# Patient Record
Sex: Male | Born: 1965 | Race: Black or African American | Hispanic: No | Marital: Married | Smoking: Current every day smoker
Health system: Southern US, Community
[De-identification: ages and names within clinical notes are randomized; demographics above are authoritative.]

## PROBLEM LIST (undated history)

## (undated) DIAGNOSIS — C801 Malignant (primary) neoplasm, unspecified: Secondary | ICD-10-CM

## (undated) DIAGNOSIS — I10 Essential (primary) hypertension: Secondary | ICD-10-CM

## (undated) DIAGNOSIS — I219 Acute myocardial infarction, unspecified: Secondary | ICD-10-CM

---

## 2017-11-15 ENCOUNTER — Emergency Department

## 2017-11-15 ENCOUNTER — Other Ambulatory Visit: Payer: Self-pay

## 2017-11-15 DIAGNOSIS — R002 Palpitations: Secondary | ICD-10-CM | POA: Diagnosis not present

## 2017-11-15 DIAGNOSIS — R0789 Other chest pain: Secondary | ICD-10-CM | POA: Insufficient documentation

## 2017-11-15 DIAGNOSIS — I10 Essential (primary) hypertension: Secondary | ICD-10-CM | POA: Insufficient documentation

## 2017-11-15 DIAGNOSIS — I2510 Atherosclerotic heart disease of native coronary artery without angina pectoris: Secondary | ICD-10-CM | POA: Insufficient documentation

## 2017-11-15 LAB — TROPONIN I

## 2017-11-15 LAB — CBC
HCT: 42.8 % (ref 40.0–52.0)
Hemoglobin: 14.4 g/dL (ref 13.0–18.0)
MCH: 29.3 pg (ref 26.0–34.0)
MCHC: 33.6 g/dL (ref 32.0–36.0)
MCV: 87.2 fL (ref 80.0–100.0)
PLATELETS: 343 10*3/uL (ref 150–440)
RBC: 4.91 MIL/uL (ref 4.40–5.90)
RDW: 13.2 % (ref 11.5–14.5)
WBC: 11.1 10*3/uL — AB (ref 3.8–10.6)

## 2017-11-15 LAB — COMPREHENSIVE METABOLIC PANEL
ALBUMIN: 4.3 g/dL (ref 3.5–5.0)
ALT: 32 U/L (ref 17–63)
AST: 23 U/L (ref 15–41)
Alkaline Phosphatase: 77 U/L (ref 38–126)
Anion gap: 10 (ref 5–15)
BUN: 8 mg/dL (ref 6–20)
CHLORIDE: 108 mmol/L (ref 101–111)
CO2: 21 mmol/L — AB (ref 22–32)
CREATININE: 0.94 mg/dL (ref 0.61–1.24)
Calcium: 9 mg/dL (ref 8.9–10.3)
GFR calc Af Amer: >60 mL/min (ref >60)
GLUCOSE: 128 mg/dL — AB (ref 65–99)
POTASSIUM: 3.7 mmol/L (ref 3.5–5.1)
SODIUM: 139 mmol/L (ref 135–145)
Total Bilirubin: 0.7 mg/dL (ref 0.3–1.2)
Total Protein: 7.4 g/dL (ref 6.5–8.1)

## 2017-11-15 NOTE — ED Triage Notes (Signed)
Pt in with co midsternal chest pain that started x 4 months ago. States has hx of angina but pain has been persistent. Took nitro at home x 1 that relieved the pain.

## 2017-11-15 NOTE — ED Notes (Signed)
Pt to STAT desk to st he is going to get something to eat; recommended pt not eat until evaluated further by the provider but cont to st he is going; pt noted leaving ED lobby to main entrance of hospital, to go to [employer]

## 2017-11-16 ENCOUNTER — Emergency Department
Admission: EM | Admit: 2017-11-16 | Discharge: 2017-11-16 | Disposition: A | Discharge status: Home / Self Care | Attending: Emergency Medicine | Admitting: Emergency Medicine

## 2017-11-16 DIAGNOSIS — F419 Anxiety disorder, unspecified: Secondary | ICD-10-CM

## 2017-11-16 DIAGNOSIS — R079 Chest pain, unspecified: Secondary | ICD-10-CM

## 2017-11-16 LAB — TROPONIN I

## 2017-11-16 MED ORDER — LORAZEPAM 1 MG PO TABS: 1.0000 mg | ORAL_TABLET | Freq: Three times a day (TID) | ORAL | 0 refills | Status: AC | PRN

## 2017-11-16 MED ORDER — ASPIRIN 81 MG PO CHEW
324.0000 mg | CHEWABLE_TABLET | Freq: Once | ORAL | Status: AC
  Administered 2017-11-16: 324 mg via ORAL
  Filled 2017-11-16: qty 4

## 2017-11-16 NOTE — ED Notes (Signed)
Patient reports CP that occurred earlier in the night, not associated with SHOB, N/V, Diaphoresis or vomiting. Patient states a history of PTSD due to previous military status, and two grown daughters living at home with he and his wife. Patient states he feels like it may have been related to anxiety. States currently chest pain-free. Patient does not have a cardiologist though moved here in 2016. Notes previous MI in 2010 without stent placement; just medications. Patient is alert and oriented, pleasant African American male. Appears well dressed and is pleasant during conversation.

## 2017-11-16 NOTE — ED Notes (Signed)
Blood drawn and sent for repeat troponin. Patient tolerated po medications without incident.

## 2017-11-16 NOTE — ED Provider Notes (Signed)
[facility] Emergency Department Provider Note   ____________________________________________   First MD Initiated Contact with Patient 11/16/17 [phone]     (approximate)  I have reviewed the triage vital signs and the nursing notes.   HISTORY  Chief Complaint Chest Pain    HPI Carl Quinn is a 52 y.o. male who presents to the ED from home with a chief complaint of chest pain.  Patient has a history of CAD status post MI in 2010, treated medically.  Moved to the area 2 years ago and has not establish cardiology care.  Reports intermittent midsternal chest pain for the past 4 months.  Pattern related to anxiety.  Reports increased anxiety last week.  Has had a 3-day history of intermittent chest pain and decided to come in for evaluation.  Pain is associated with occasional palpitations.  Denies associated diaphoresis, shortness of breath, nausea/vomiting, dizziness.  Took sublingual nitroglycerin at home which relieved the pain.  Denies recent fever, chills, cough, congestion, abdominal pain, dysuria, diarrhea.  Denies recent travel, trauma or hormone use.   Past medical history CAD Hypertension  There are no active problems to display for this patient.    Prior to Admission medications   Medication Sig Start Date End Date Taking? Authorizing Provider  LORazepam (ATIVAN) 1 MG tablet Take 1 tablet (1 mg total) by mouth every 8 (eight) hours as needed for anxiety. 11/16/17   Paulette Blanch, MD    Allergies Patient has no known allergies.  Family history None for CAD  Social History Social History   Tobacco Use  . Smoking status: Not on file  Substance Use Topics  . Alcohol use: Not on file  . Drug use: Not on file  Smoker  Review of Systems  Constitutional: No fever/chills. Eyes: No visual changes. ENT: No sore throat. Cardiovascular: Positive for chest pain. Respiratory: Denies shortness of breath. Gastrointestinal: No abdominal pain.  No  nausea, no vomiting.  No diarrhea.  No constipation. Genitourinary: Negative for dysuria. Musculoskeletal: Negative for back pain. Skin: Negative for rash. Neurological: Negative for headaches, focal weakness or numbness.   ____________________________________________   PHYSICAL EXAM:  VITAL SIGNS: ED Triage Vitals  Enc Vitals Group     BP 11/15/17 2240 (!) 149/92     Pulse Rate 11/15/17 2240 69     Resp 11/15/17 2240 16     Temp 11/15/17 2240 98.2 F (36.8 C)     Temp src --      SpO2 11/15/17 2240 96 %     Weight 11/15/17 2239 222 lb (100.7 kg)     Height 11/15/17 2239 6\' 2"  (1.88 m)     Head Circumference --      Peak Flow --      Pain Score 11/15/17 2239 3     Pain Loc --      Pain Edu? --      Excl. in [place]? --     Constitutional: Alert and oriented. Well appearing and in no acute distress. Eyes: Conjunctivae are normal. PERRL. EOMI. Head: Atraumatic. Nose: No congestion/rhinnorhea. Mouth/Throat: Mucous membranes are moist.  Oropharynx non-erythematous. Neck: No stridor.   Cardiovascular: Normal rate, regular rhythm. Grossly normal heart sounds.  Good peripheral circulation. Respiratory: Normal respiratory effort.  No retractions. Lungs CTAB. Gastrointestinal: Soft and nontender. No distention. No abdominal bruits. No CVA tenderness. Musculoskeletal: No lower extremity tenderness nor edema.  No joint effusions. Neurologic:  Normal speech and language. No gross focal neurologic deficits  are appreciated. No gait instability. Skin:  Skin is warm, dry and intact. No rash noted. Psychiatric: Mood and affect are normal. Speech and behavior are normal.  ____________________________________________   LABS (all labs ordered are listed, but only abnormal results are displayed)  Labs Reviewed  CBC - Abnormal; Notable for the following components:      Result Value   WBC 11.1 (*)    All other components within normal limits  COMPREHENSIVE METABOLIC PANEL - Abnormal;  Notable for the following components:   CO2 21 (*)    Glucose, Bld 128 (*)    All other components within normal limits  TROPONIN I  TROPONIN I   ____________________________________________  EKG  ED ECG REPORT I, Race Latour J, the attending physician, personally viewed and interpreted this ECG.   Date: 11/16/2017  EKG Time: 2241  Rate: 67  Rhythm: normal EKG, normal sinus rhythm  Axis: Normal  Intervals:none  ST&T Change: Nonspecific  ____________________________________________  RADIOLOGY  Dg Chest 2 View  Result Date: 11/15/2017 CLINICAL DATA:  Chest pain EXAM: CHEST  2 VIEW COMPARISON:  None. FINDINGS: Normal heart size. Normal mediastinal contour. No pneumothorax. No pleural effusion. Lungs appear clear, with no acute consolidative airspace disease and no pulmonary edema. IMPRESSION: No active cardiopulmonary disease. Electronically Signed   By: Ilona Sorrel M.D.   On: 11/15/2017 22:57    ____________________________________________   PROCEDURES  Procedure(s) performed: None  Procedures  Critical Care performed: No  ____________________________________________   INITIAL IMPRESSION / ASSESSMENT AND PLAN / ED COURSE  As part of my medical decision making, I reviewed the following data within the [place] notes reviewed and incorporated, Labs reviewed, EKG interpreted, Old chart reviewed (none available), Radiograph reviewed and Notes from prior ED visits (none available).   52 year old male who presents with chest pain. Differential diagnosis includes, but is not limited to, ACS, aortic dissection, pulmonary embolism, cardiac tamponade, pneumothorax, pneumonia, pericarditis, myocarditis, GI-related causes including esophagitis/gastritis, and musculoskeletal chest wall pain.    Initial EKG and troponin unremarkable.  Sounds like anxiety is a component which contributes to patient's pain.  Currently chest pain-free.  He is a [place] patient.   Anticipate discharge home if repeat troponin remains unremarkable.  Will provide local cardiology follow-up if patient desires.  Clinical Course as of Nov 16 598  Wed Nov 16, 2017  0317 Patient remains chest pain-free.  Updated him of repeat negative troponin.  He will try to establish cardiology follow-up at the [place].  Will also refer him to local cardiology.  Limited quantity of Ativan prescribed for anxiety.  Strict return precautions given.  Patient verbalizes understanding and agrees with plan of care.  [JS]    Clinical Course User Index [JS] Paulette Blanch, MD     ____________________________________________   FINAL CLINICAL IMPRESSION(S) / ED DIAGNOSES  Final diagnoses:  Chest pain, unspecified type  Anxiety     ED Discharge Orders        Ordered    LORazepam (ATIVAN) 1 MG tablet  Every 8 hours PRN     11/16/17 0318       Note:  This document was prepared using Dragon voice recognition software and may include unintentional dictation errors.    Paulette Blanch, MD 11/16/17 0600

## 2017-11-16 NOTE — Discharge Instructions (Signed)
1.  Continue all current medicines as directed by your doctor. 2.  You may take Ativan as needed for anxiety/stress. 3.  Return to the ER for worsening symptoms, persistent vomiting, difficulty breathing or other concerns.

## 2017-12-28 ENCOUNTER — Ambulatory Visit: Payer: TRICARE For Life (TFL) | Admitting: Internal Medicine

## 2019-05-05 ENCOUNTER — Encounter: Payer: Self-pay | Admitting: Emergency Medicine

## 2019-05-05 ENCOUNTER — Emergency Department
Admission: EM | Admit: 2019-05-05 | Discharge: 2019-05-05 | Disposition: A | Discharge status: Home / Self Care | Attending: Emergency Medicine | Admitting: Emergency Medicine

## 2019-05-05 ENCOUNTER — Emergency Department

## 2019-05-05 ENCOUNTER — Other Ambulatory Visit: Payer: Self-pay

## 2019-05-05 DIAGNOSIS — E860 Dehydration: Secondary | ICD-10-CM | POA: Insufficient documentation

## 2019-05-05 DIAGNOSIS — C61 Malignant neoplasm of prostate: Secondary | ICD-10-CM | POA: Insufficient documentation

## 2019-05-05 DIAGNOSIS — I10 Essential (primary) hypertension: Secondary | ICD-10-CM | POA: Diagnosis not present

## 2019-05-05 DIAGNOSIS — F17210 Nicotine dependence, cigarettes, uncomplicated: Secondary | ICD-10-CM | POA: Insufficient documentation

## 2019-05-05 DIAGNOSIS — I252 Old myocardial infarction: Secondary | ICD-10-CM | POA: Insufficient documentation

## 2019-05-05 DIAGNOSIS — R631 Polydipsia: Secondary | ICD-10-CM | POA: Insufficient documentation

## 2019-05-05 DIAGNOSIS — R42 Dizziness and giddiness: Secondary | ICD-10-CM | POA: Diagnosis present

## 2019-05-05 DIAGNOSIS — R61 Generalized hyperhidrosis: Secondary | ICD-10-CM | POA: Insufficient documentation

## 2019-05-05 DIAGNOSIS — R55 Syncope and collapse: Secondary | ICD-10-CM | POA: Insufficient documentation

## 2019-05-05 HISTORY — DX: Essential (primary) hypertension: I10

## 2019-05-05 HISTORY — DX: Malignant (primary) neoplasm, unspecified: C80.1

## 2019-05-05 HISTORY — DX: Acute myocardial infarction, unspecified: I21.9

## 2019-05-05 LAB — URINALYSIS, COMPLETE (UACMP) WITH MICROSCOPIC
Bacteria, UA: NONE SEEN
Bilirubin Urine: NEGATIVE
Glucose, UA: >=500 mg/dL — AB
Hgb urine dipstick: NEGATIVE
Ketones, ur: 5 mg/dL — AB
Leukocytes,Ua: NEGATIVE
Nitrite: NEGATIVE
Protein, ur: 100 mg/dL — AB
Specific Gravity, Urine: 1.028 (ref 1.005–1.030)
Squamous Epithelial / LPF: NONE SEEN (ref 0–5)
pH: 5 (ref 5.0–8.0)

## 2019-05-05 LAB — BASIC METABOLIC PANEL
Anion gap: 10 (ref 5–15)
BUN: 11 mg/dL (ref 6–20)
CO2: 24 mmol/L (ref 22–32)
Calcium: 9.1 mg/dL (ref 8.9–10.3)
Chloride: 104 mmol/L (ref 98–111)
Creatinine, Ser: 1.08 mg/dL (ref 0.61–1.24)
GFR calc Af Amer: >60 mL/min (ref >60)
GFR calc non Af Amer: >60 mL/min (ref >60)
Glucose, Bld: 241 mg/dL — ABNORMAL HIGH (ref 70–99)
Potassium: 4.4 mmol/L (ref 3.5–5.1)
Sodium: 138 mmol/L (ref 135–145)

## 2019-05-05 LAB — TROPONIN I (HIGH SENSITIVITY)
Troponin I (High Sensitivity): 5 ng/L (ref <18)
Troponin I (High Sensitivity): 5 ng/L (ref <18)

## 2019-05-05 LAB — CBC
HCT: 43.1 % (ref 39.0–52.0)
Hemoglobin: 14.3 g/dL (ref 13.0–17.0)
MCH: 29.8 pg (ref 26.0–34.0)
MCHC: 33.2 g/dL (ref 30.0–36.0)
MCV: 89.8 fL (ref 80.0–100.0)
Platelets: 293 10*3/uL (ref 150–400)
RBC: 4.8 MIL/uL (ref 4.22–5.81)
RDW: 12 % (ref 11.5–15.5)
WBC: 8.7 10*3/uL (ref 4.0–10.5)
nRBC: 0 % (ref 0.0–0.2)

## 2019-05-05 MED ORDER — OXYCODONE-ACETAMINOPHEN 5-325 MG PO TABS
1.0000 | ORAL_TABLET | Freq: Once | ORAL | Status: AC
  Administered 2019-05-05: 17:00:00 1 via ORAL
  Filled 2019-05-05: qty 1

## 2019-05-05 MED ORDER — IBUPROFEN 800 MG PO TABS
800.0000 mg | ORAL_TABLET | ORAL | Status: AC
  Administered 2019-05-05: 14:00:00 800 mg via ORAL

## 2019-05-05 MED ORDER — TRAMADOL HCL 50 MG PO TABS: 50.0000 mg | ORAL_TABLET | Freq: Four times a day (QID) | ORAL | 0 refills | Status: AC | PRN

## 2019-05-05 MED ORDER — TRAMADOL HCL 50 MG PO TABS: 50.0000 mg | ORAL_TABLET | Freq: Four times a day (QID) | ORAL | 0 refills | Status: DC | PRN

## 2019-05-05 NOTE — ED Provider Notes (Addendum)
-----------------------------------------   4:41 PM on 05/05/2019 -----------------------------------------  Signed out to me at 330 with a fall today.  He has ongoing very reproducible pain in the bilateral trapezius muscles.  I do not think that represents ACS, per signout, if his second troponin, which is pending, is negative he is to go home.  Patient is complaining of pain in the neck muscles when he changes position or moves.  He does not have midline tenderness but he does have pain on both sides.  I will get a CT scan to rule out injury although have low suspicion for fracture as he has no real midline tenderness, but to be on the side of safety given that he fell we will obtain that.  If that is negative, and his troponin is negative is a strong preference of the patient to go home and I think that is unreasonable.  He is otherwise in no acute distress texting on his telephone he is neurologically intact and there is no evidence of ACS PE dissection, he was outside and very high heat index day and became lightheaded EKG is reassuring.  We are awaiting the second troponin.  ----------------------------------------- 6:30 PM on 05/05/2019 -----------------------------------------  Eating and drinking in no acute distress he has chronic neck pain for which he is on disability, he states this feels exactly same as his chronic neck pain.  He and I talked extensively about admission versus discharge.  There is a large quantity of patients with coronavirus coming through this department and he strongly does not want to be admitted he would like to go home.  He understands risk benefits alternative both to discharge and admission and he was very strongly prefer to be discharged.  He also does not want to be transferred to the [place] at this time.  We will discharge him with close outpatient follow-up return precautions given and understood   Schuyler Amor, MD 05/05/19 1642    Schuyler Amor,  MD 05/05/19 [phone]

## 2019-05-05 NOTE — ED Notes (Signed)
Patient transported to CT 

## 2019-05-05 NOTE — ED Notes (Signed)
Pt transported to xray 

## 2019-05-05 NOTE — ED Notes (Signed)
Patient complaining of some neck pain, MD notified that patient may need additional pain medication

## 2019-05-05 NOTE — ED Triage Notes (Signed)
Pt presents from home via [facility] with c/o syncopal episode after working in yard. Pt c/o dizziness and nausea. Small laceration to back of head from syncopal episode, bleeding controlled. Pt vomiting twice for ems , 4mg  zofran given in route and 546mL normal saline. Hx of STEMI in 2010. 216 blood sugar. pt denies chest pain at this time. 105/75 laying bp, 80systolic bp standing up for ems. Pt states he did not eat breakfast today.

## 2019-05-05 NOTE — ED Provider Notes (Signed)
[facility] Emergency Department Provider Note   ____________________________________________   First MD Initiated Contact with Patient 05/05/19 1401     (approximate)  I have reviewed the triage vital signs and the nursing notes.   HISTORY  Chief Complaint Loss of Consciousness    HPI Carl Quinn is a 53 y.o. male previous history of hypertension MI  Denies history of congestive heart failure.  Previous MI in 2010.  Reports he is out in the yard today, he had not had breakfast and he been working for about an hour and a half doing grass work and [employer]  Reports that towards the end he started to feel very lightheaded hot sweaty and felt very thirsty.  He went into the kitchen to get a drink of water and as he was getting into the refrigerator started feeling very lightheaded and then awoke on the ground.  He reports his wife found him called 9 1.  He reports he lost consciousness briefly but awoke on the floor right after  No chest pain.  No racing heart.  No palpitations.  No shortness of breath.  Feels sore across his back and he fell on the ground.  Denies injuries to the arms or legs.  Again no chest pain no abdominal pain is been in normal health without any exposure to anyone with coronavirus  He was diagnosed recently with possibly some very early prostate cancer and has follow-up with the [facility] but reports they told him it was just limited slow-growing in the prostate only.   Past Medical History:  Diagnosis Date  . Cancer ([place])   . Hypertension   . MI (myocardial infarction) ([place])     There are no active problems to display for this patient.     Prior to Admission medications   Medication Sig Start Date End Date Taking? Authorizing Provider  LORazepam (ATIVAN) 1 MG tablet Take 1 tablet (1 mg total) by mouth every 8 (eight) hours as needed for anxiety. 11/16/17   Paulette Blanch, MD    Allergies Patient has no known  allergies.  No family history on file.  Social History Social History   Tobacco Use  . Smoking status: Current Every Day Smoker    Packs/day: 0.50    Types: Cigarettes  . Smokeless tobacco: Never Used  Substance Use Topics  . Alcohol use: Not on file  . Drug use: Not on file    Review of Systems Constitutional: No fever/chills but felt very lightheaded and dehydrated thirsty after working Eyes: No visual changes.  He did however feel hazy before he passed out. ENT: No sore throat. Cardiovascular: Denies chest pain. Respiratory: Denies shortness of breath. Gastrointestinal: No abdominal pain.   Genitourinary: Negative for dysuria. Musculoskeletal: Negative for back pain. Skin: Negative for rash. Neurological: Negative for headaches, areas of focal weakness or numbness.  Positive for syncope    ____________________________________________   PHYSICAL EXAM:  VITAL SIGNS: ED Triage Vitals  Enc Vitals Group     BP 05/05/19 1322 121/75     Pulse Rate 05/05/19 1321 73     Resp 05/05/19 1321 (!) 22     Temp 05/05/19 1316 97.8 F (36.6 C)     Temp Source 05/05/19 1316 Oral     SpO2 05/05/19 1321 97 %     Weight 05/05/19 1316 207 lb (93.9 kg)     Height 05/05/19 1316 6' (1.829 m)     Head Circumference --  Peak Flow --      Pain Score 05/05/19 1316 10     Pain Loc --      Pain Edu? --      Excl. in [place]? --     Constitutional: Alert and oriented. Well appearing and in no acute distress. Eyes: Conjunctivae are normal. Head: Atraumatic. Nose: No congestion/rhinnorhea. Mouth/Throat: Mucous membranes are moist. Neck: No stridor.  No tenderness along the cervical thoracic or lumbar spine.  Full range of motion neck without pain. Cardiovascular: Normal rate, regular rhythm. Grossly normal heart sounds.  Good peripheral circulation.  Patient feels slightly lightheaded when sitting up.  Reports some moderate tenderness across the posterior upper thorax without bruising  bleeding or edema noted. Respiratory: Normal respiratory effort.  No retractions. Lungs CTAB. Gastrointestinal: Soft and nontender. No distention. Musculoskeletal: No lower extremity tenderness nor edema. Neurologic:  Normal speech and language. No gross focal neurologic deficits are appreciated.  Skin:  Skin is warm, dry and intact. No rash noted. Psychiatric: Mood and affect are normal. Speech and behavior are normal.  ____________________________________________   LABS (all labs ordered are listed, but only abnormal results are displayed)  Labs Reviewed  BASIC METABOLIC PANEL - Abnormal; Notable for the following components:      Result Value   Glucose, Bld 241 (*)    All other components within normal limits  CBC  URINALYSIS, COMPLETE (UACMP) WITH MICROSCOPIC  TROPONIN I (HIGH SENSITIVITY)  TROPONIN I (HIGH SENSITIVITY)  ____________________________________________  EKG  Reviewed interpreted me at 1315 Heart rate 75 QRS 100 QTc 430 Normal sinus rhythm, there is no evidence of acute ischemia, there is the slightest appearance of J-point elevation in V2.  Does not appear ischemic  Repeat EKG performed at 1405 Heart rate 70 QRS 110 QTc 420 Normal sinus rhythm, no evidence of ischemia with again seen slight J-point elevation.  The same as previous ____________________________________________  RADIOLOGY  Dg Chest 2 View  Result Date: 05/05/2019 CLINICAL DATA:  Patient status post syncopal episode. EXAM: CHEST - 2 VIEW COMPARISON:  Chest radiograph 11/15/2017 FINDINGS: Stable cardiac and mediastinal contours. No consolidative pulmonary opacities. No pleural effusion or pneumothorax. Regional skeleton is unremarkable. IMPRESSION: No acute cardiopulmonary process. Electronically Signed   By: Lovey Newcomer M.D.   On: 05/05/2019 14:51   Ct Head Wo Contrast  Result Date: 05/05/2019 CLINICAL DATA:  Patient with dizziness and nausea. Syncopal episode. EXAM: CT HEAD WITHOUT CONTRAST  TECHNIQUE: Contiguous axial images were obtained from the base of the skull through the vertex without intravenous contrast. COMPARISON:  None. FINDINGS: Brain: Ventricles and sulci are appropriate for patient's age. No evidence for acute cortically based infarct, intracranial hemorrhage, mass lesion or mass-effect. Vascular: Unremarkable Skull: Intact. Sinuses/Orbits: Paranasal sinuses are well aerated. Mastoid air cells are unremarkable. Orbits are unremarkable. Other: None. IMPRESSION: No acute intracranial process. Electronically Signed   By: Lovey Newcomer M.D.   On: 05/05/2019 13:55   CT head reviewed negative for acute ____________________________________________   PROCEDURES  Procedure(s) performed: None  Procedures  Critical Care performed: No  ____________________________________________   INITIAL IMPRESSION / ASSESSMENT AND PLAN / ED COURSE  Pertinent labs & imaging results that were available during my care of the patient were reviewed by me and considered in my medical decision making (see chart for details).   Patient returns for evaluation after syncopal episode.  Clinical history seem to suggest likely secondary to hypotension which I suspect is related to dehydration.  He is fully recovered  neurologically intact.  Head CT negative.  No acute neurologic symptoms.  Denies chest pain or shortness of breath.  Is having some pain discomfort across his upper posterior thorax but not along the spinous processes was reported soreness after falling onto his back.  Small skin tear to the back of the scalp not bleeding without need for repair.  Up-to-date on tetanus  Suspect orthostatic symptoms, also feels lightheaded when he sits up here in the ER.  Will hydrate generously, allow a diet, and observe him.  Send troponins for further evaluation given his heart history though at this time he lacks chest pain.  Nexus negative for neck injury.     -----------------------------------------  3:31 PM on 05/05/2019 -----------------------------------------  Ongoing care assigned to Dr. Burlene Arnt.  Patient receiving IV hydration for syncopal episode.  Anticipate 2 troponins prior to disposition decision.  I suspect he became dehydrated leading to syncopal episode today.  ____________________________________________   FINAL CLINICAL IMPRESSION(S) / ED DIAGNOSES  Final diagnoses:  Dehydration, mild  Syncope and collapse        Note:  This document was prepared using Dragon voice recognition software and may include unintentional dictation errors       Delman Kitten, MD 05/05/19 1532

## 2020-07-27 IMAGING — CT CT HEAD WITHOUT CONTRAST
3 series · 16 of 47 positions shown, 19 images · non-contrast
Comparison: None.

CLINICAL DATA: Patient with dizziness and nausea. Syncopal episode.

EXAM:
CT HEAD WITHOUT CONTRAST
TECHNIQUE: Contiguous axial images were obtained from the base of the skull
through the vertex without intravenous contrast.

[Series 3: head wo · axial · 0.44mm/px · z∈[+577,+702]mm · 10 of 30 slices shown, 13 images]
[im 3/30  brain]
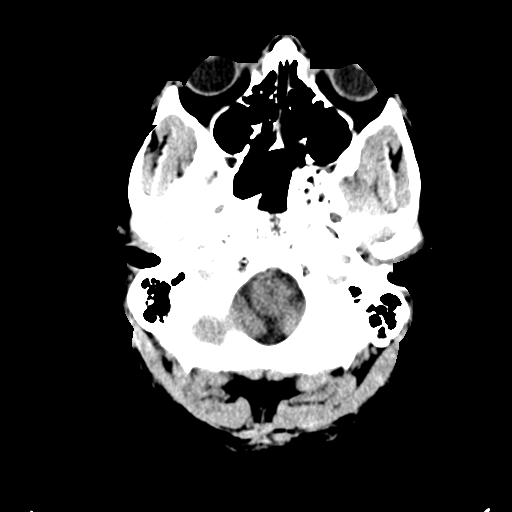
[im 3/30  bone]
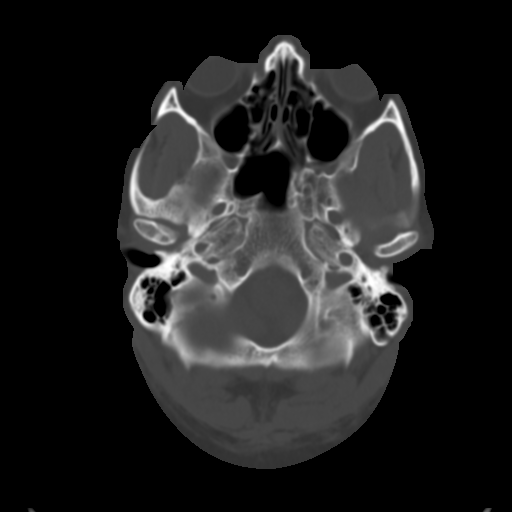
[im 6/30  brain]
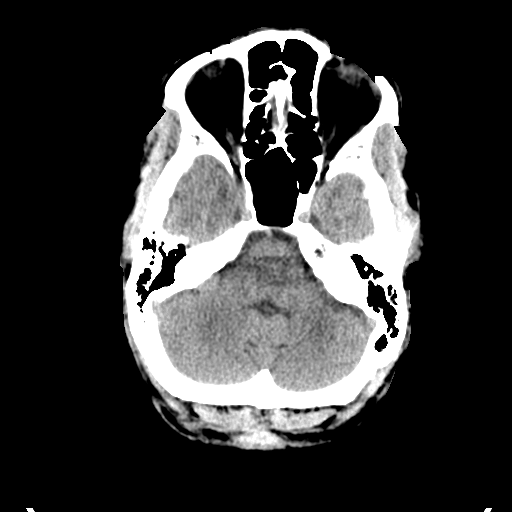
[im 9/30  brain]
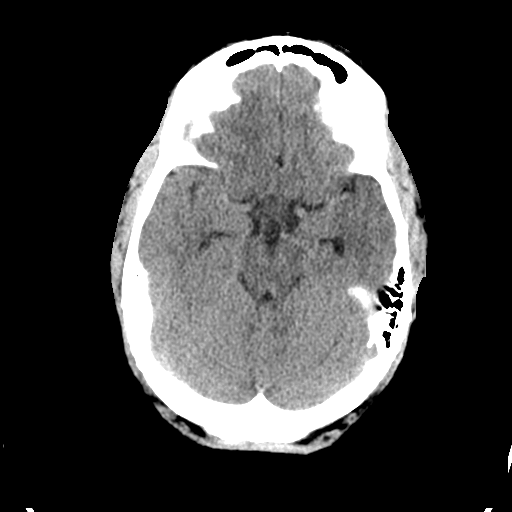
[im 11/30  brain]
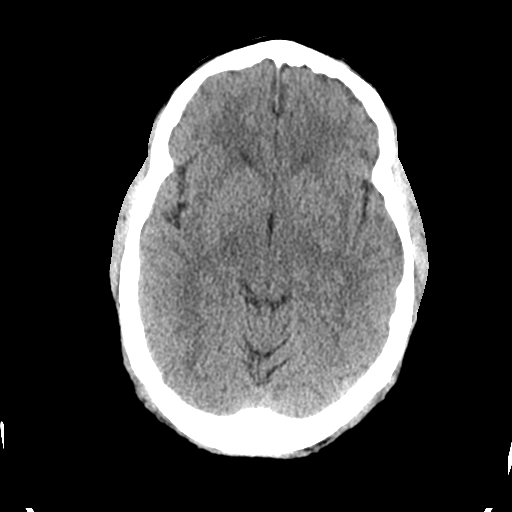
[im 14/30  brain]
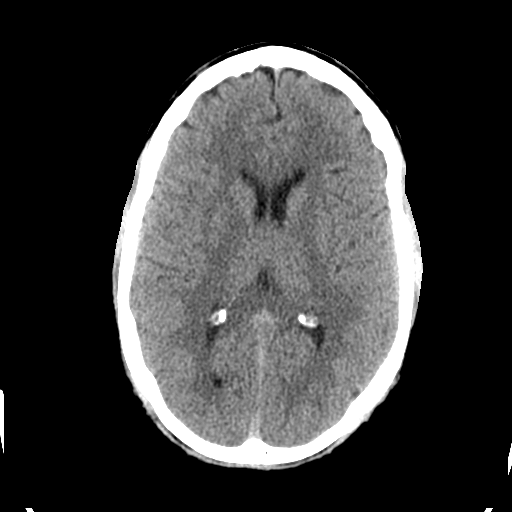
[im 14/30  bone]
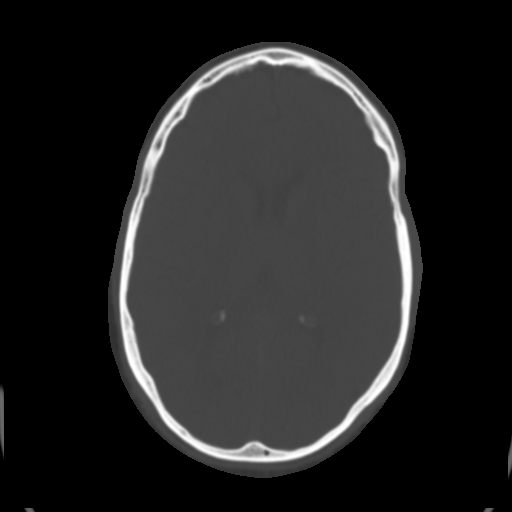
[im 17/30  brain]
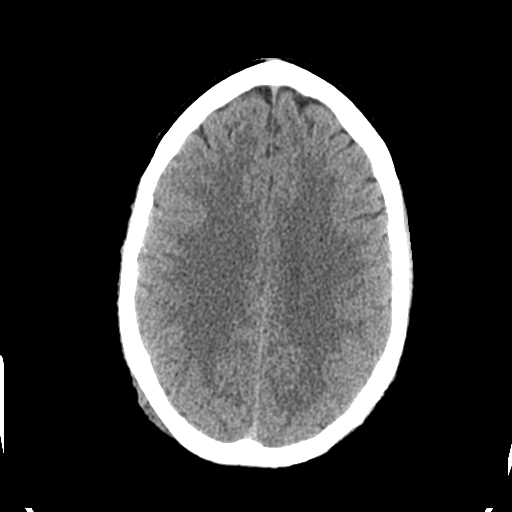
[im 20/30  brain]
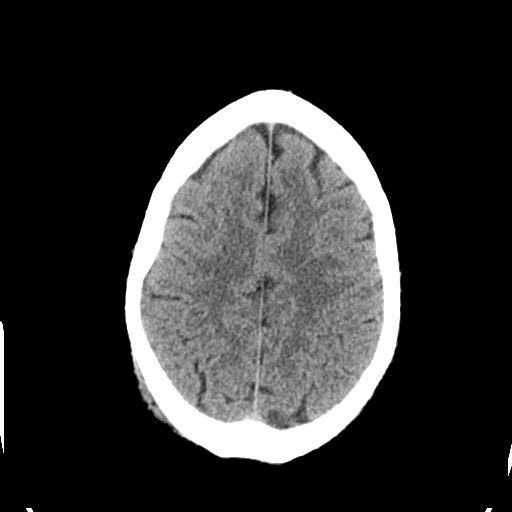
[im 23/30  brain]
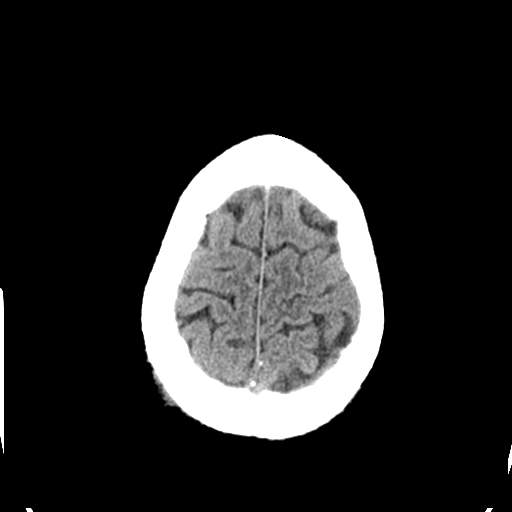
[im 25/30  brain]
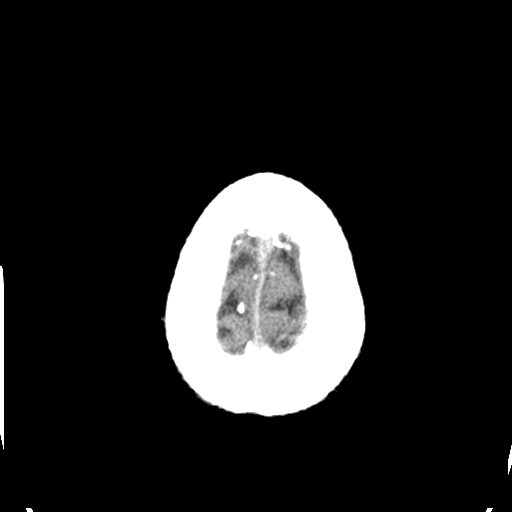
[im 25/30  bone]
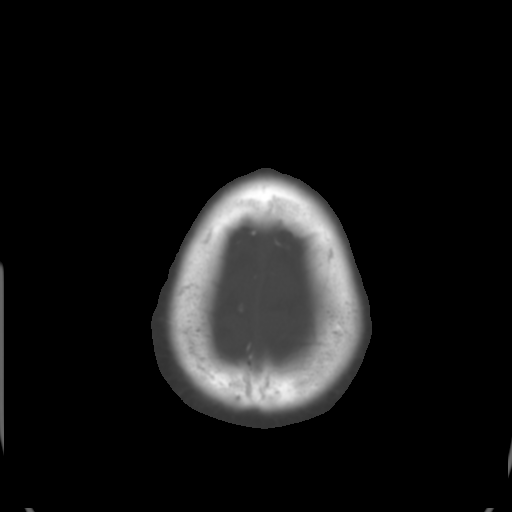
[im 28/30  brain]
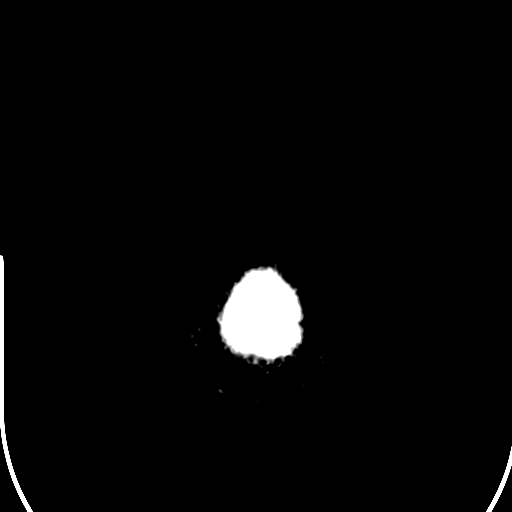

[Series 4: coronal soft tissue · coronal · 0.29mm/px · 3 of 62 slices shown]
[im 22/62  brain]
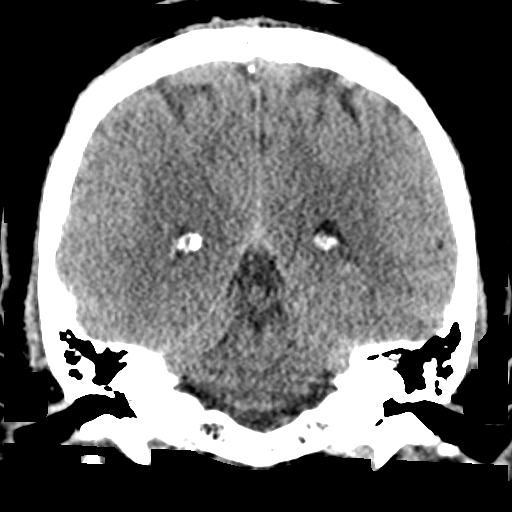
[im 28/62  brain]
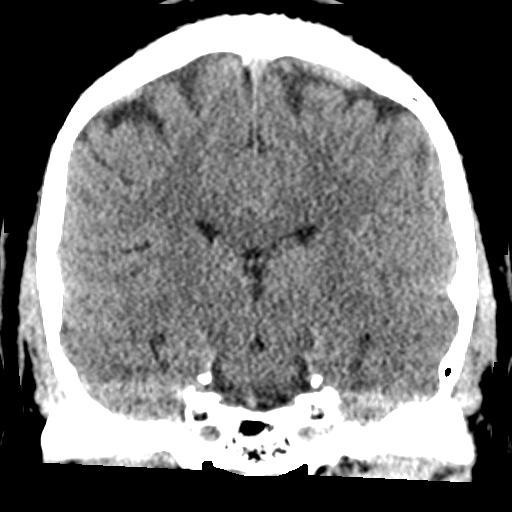
[im 34/62  brain]
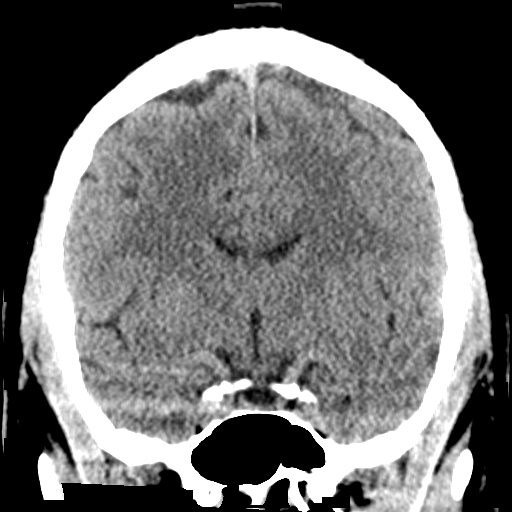

[Series 5: sagittal soft tissue · sagittal · 0.29mm/px · 3 of 51 slices shown]
[im 17/51  brain]
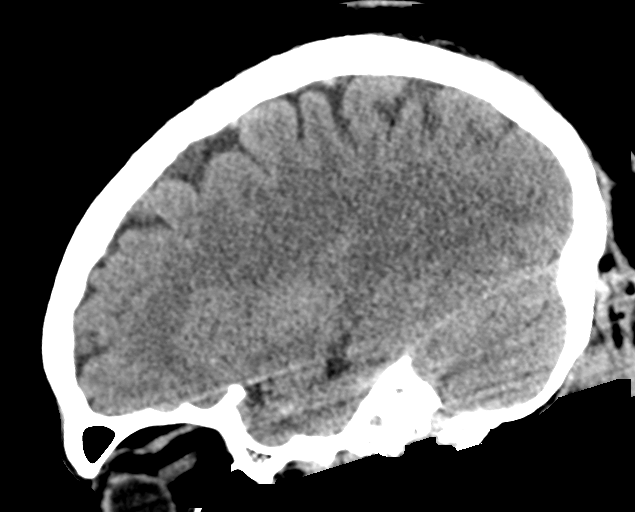
[im 26/51  brain]
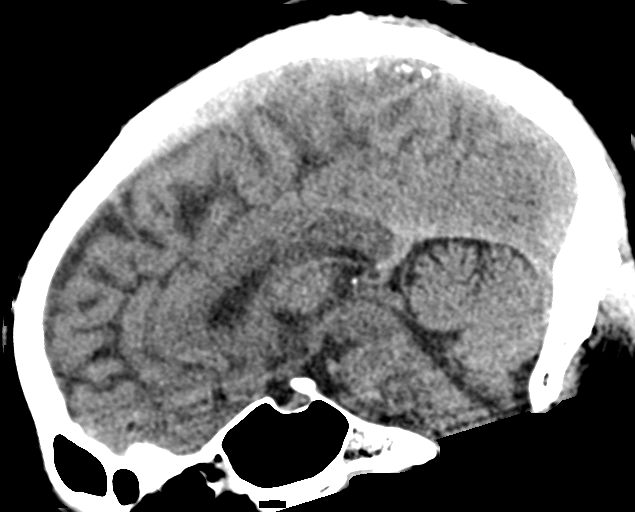
[im 34/51  brain]
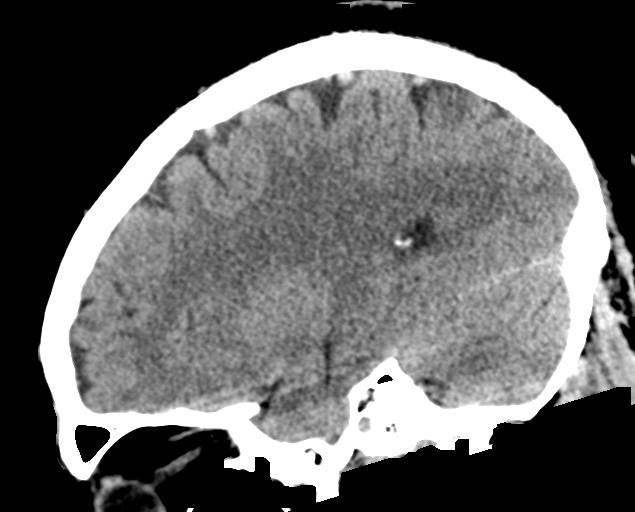

[16 of 47 positions shown; findings below may reference images not displayed]

FINDINGS: Brain: Ventricles and sulci are appropriate for patient's age. No
evidence for acute cortically based infarct, intracranial
hemorrhage, mass lesion or mass-effect.

Vascular: Unremarkable

Skull: Intact.

Sinuses/Orbits: Paranasal sinuses are well aerated. Mastoid air
cells are unremarkable. Orbits are unremarkable.

Other: None.
IMPRESSION: No acute intracranial process.

## 2020-07-27 IMAGING — CT CT CERVICAL SPINE WITHOUT CONTRAST
3 of 4 series · 9 of 33 positions shown, 11 images · non-contrast
Comparison: None.

CLINICAL DATA: Fall today.  Neck pain.

EXAM:
CT CERVICAL SPINE WITHOUT CONTRAST
TECHNIQUE: Multidetector CT imaging of the cervical spine was performed without
intravenous contrast. Multiplanar CT image reconstructions were also
generated.

[Series 4: sagittal bone · sagittal · 0.24mm/px · 5 of 65 slices shown, 6 images]
[im 22/65  bone]
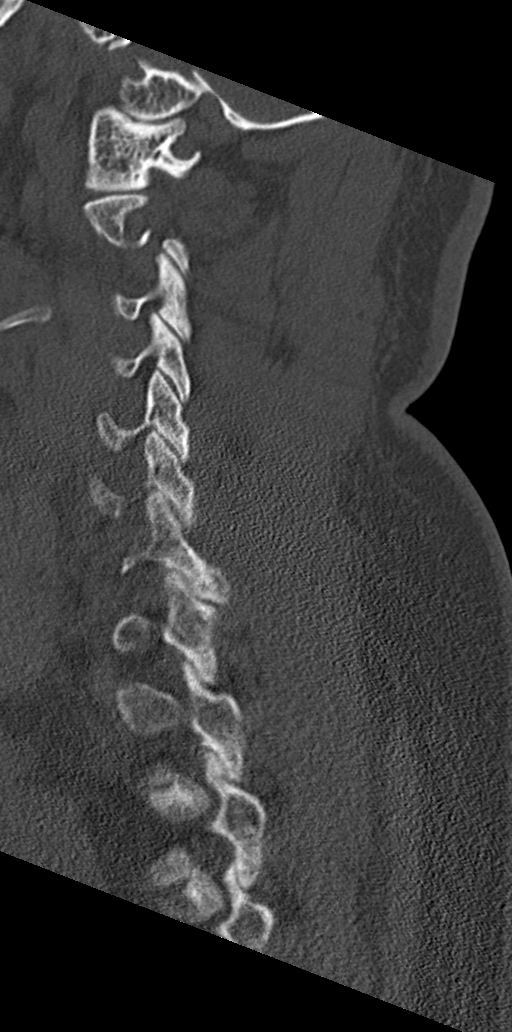
[im 27/65  bone]
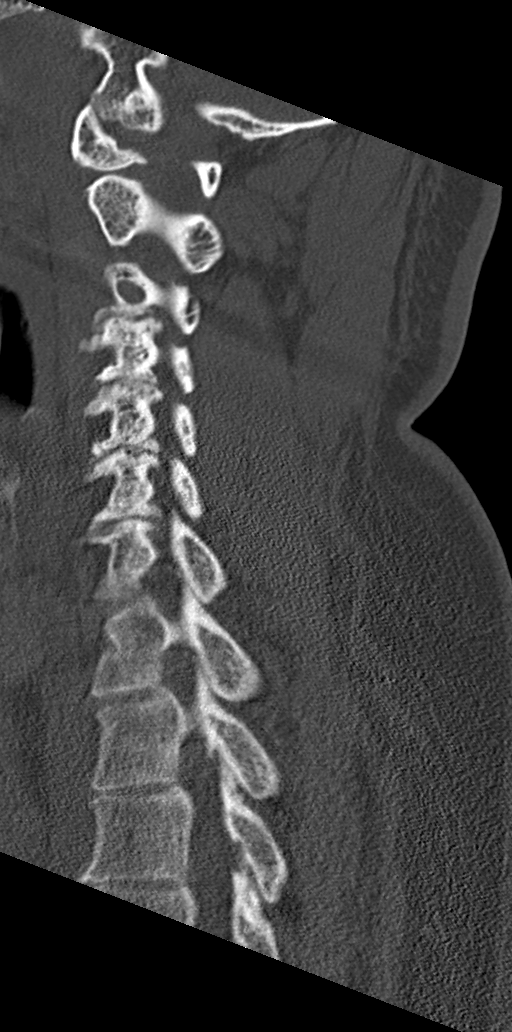
[im 33/65  soft-tissue]
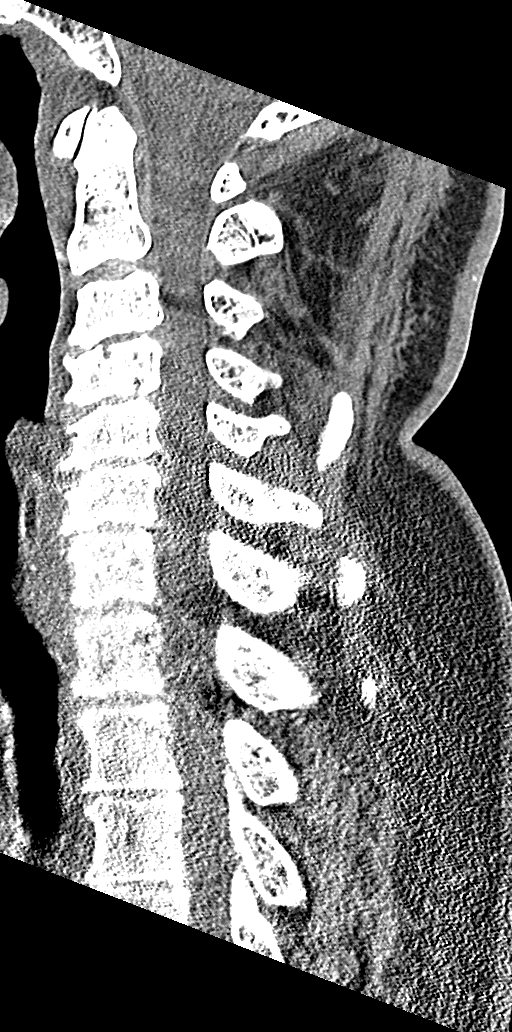
[im 33/65  bone]
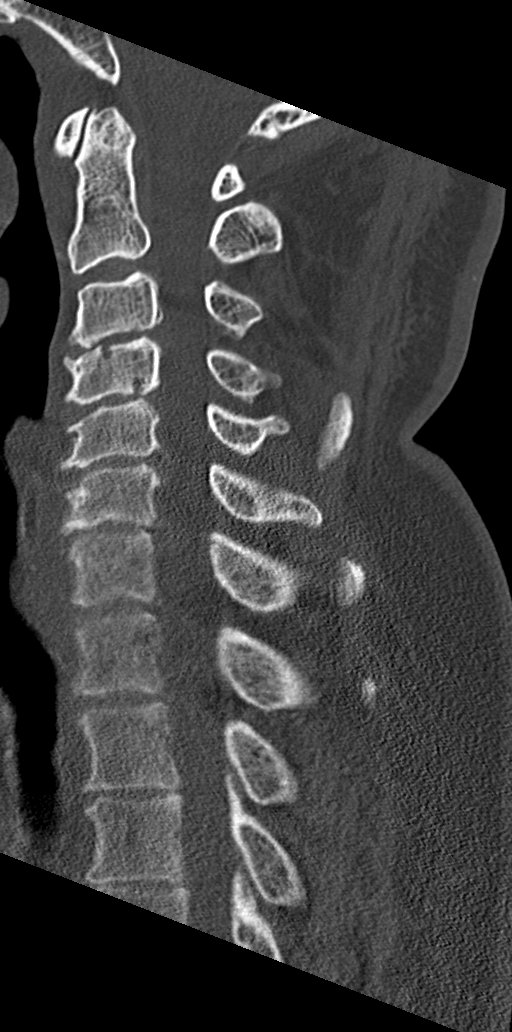
[im 38/65  bone]
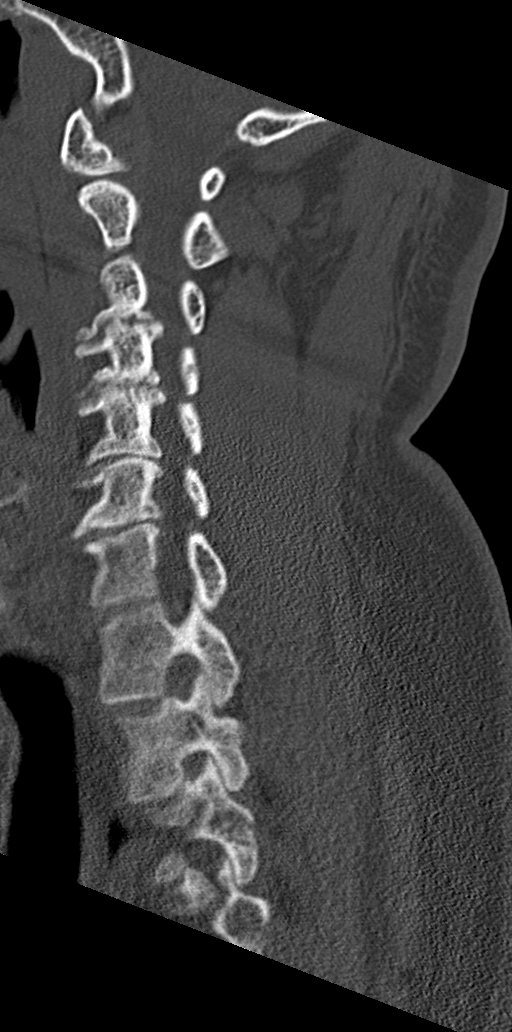
[im 43/65  bone]
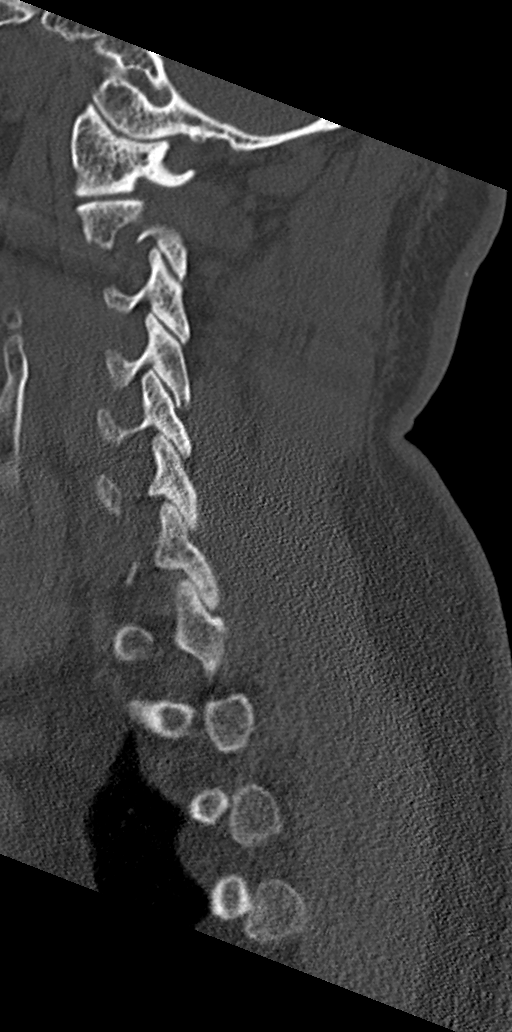

[Series 5: coronal bone · coronal · 0.25mm/px · 3 of 61 slices shown]
[im 13/61  bone]
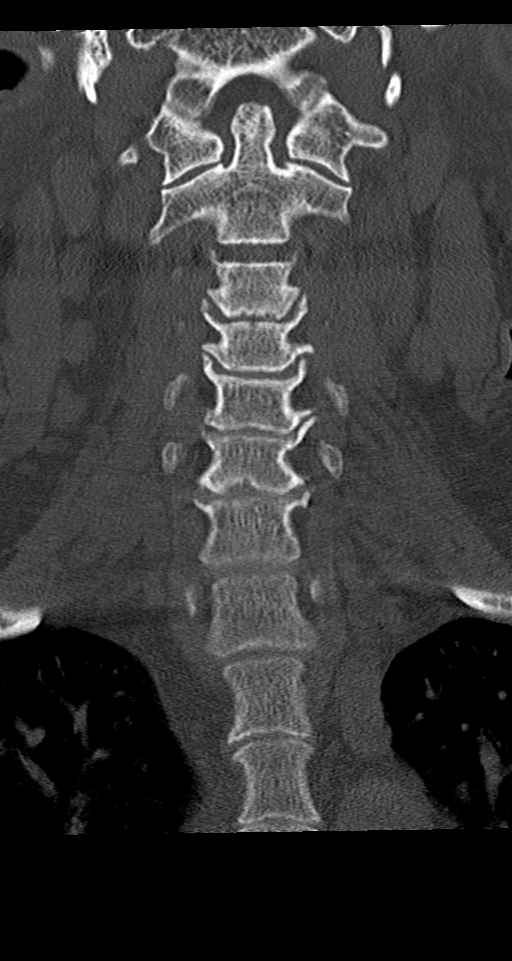
[im 25/61  bone]
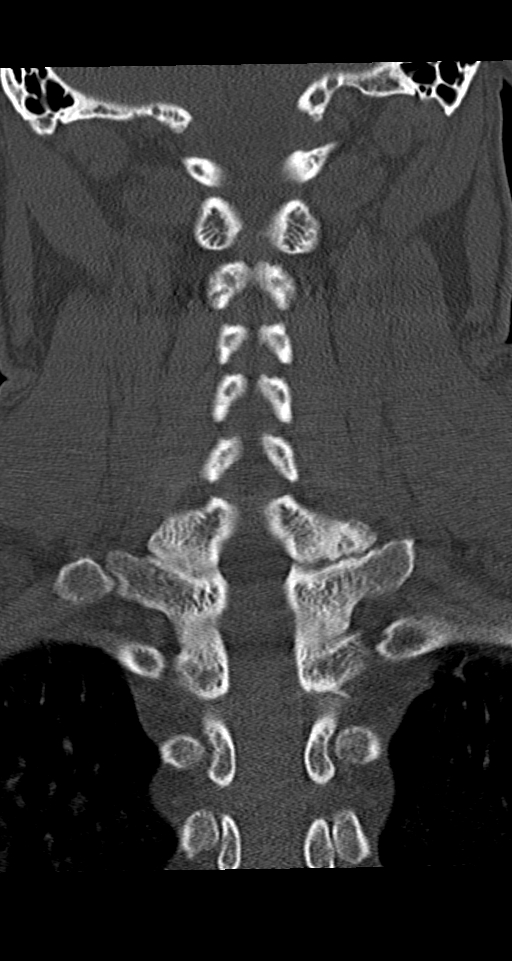
[im 37/61  bone]
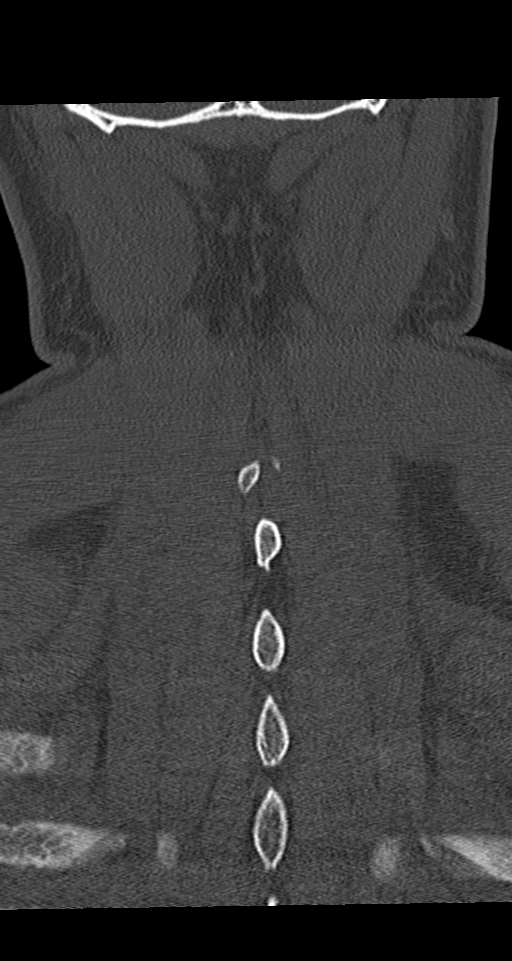

[Series 6: orthogonal bone · axial · 0.24mm/px · z∈[-174,-174]mm · 1 of 123 slices shown, 2 images]
[im 70/123  soft-tissue]
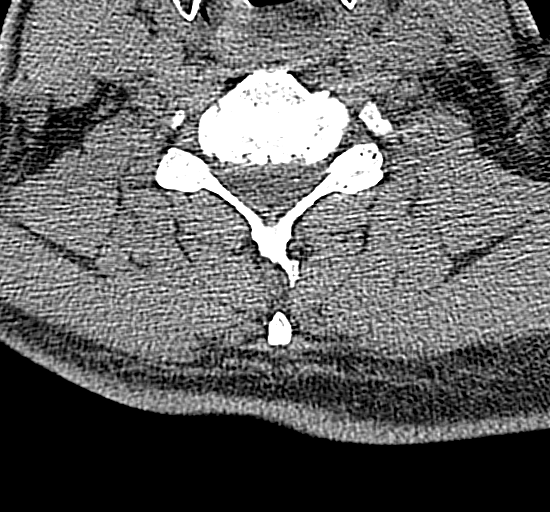
[im 70/123  bone]
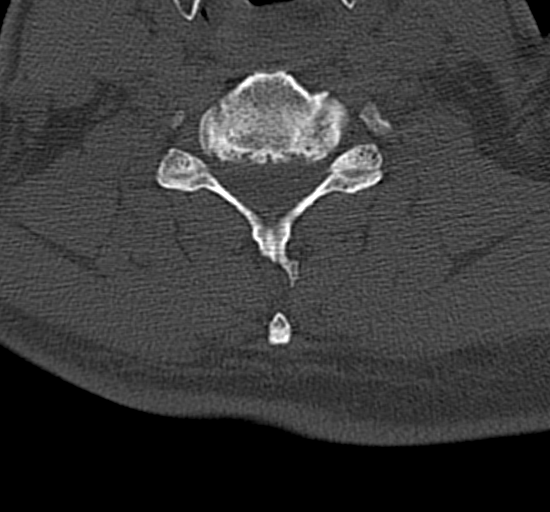

[9 of 33 positions shown; findings below may reference images not displayed]

FINDINGS: Alignment: Straightening of the cervical spine. No facet
subluxation. Dens is well positioned between the lateral masses of
C1.

Skull base and vertebrae: No acute fracture. No primary bone lesion
or focal pathologic process.

Soft tissues and spinal canal: No prevertebral edema. No visible
canal hematoma.

Disc levels: Marked multilevel cervical degenerative disc disease.
Mild bilateral facet arthropathy. Mild bilateral degenerative
foraminal stenosis at C3-4. Severe right and moderate left
degenerative foraminal stenosis at C4-5. Moderate bilateral
degenerative foraminal stenosis at C5-6. Moderate left degenerative
foraminal stenosis at C6-7.

Upper chest: No acute abnormality.

Other: Visualized mastoid air cells appear clear. Subcentimeter
hypodense posterior left thyroid lobe nodule. No pathologically
enlarged cervical nodes.
IMPRESSION: 1. No cervical spine fracture or subluxation.
2. Moderate to marked multilevel degenerative changes in the
cervical spine as detailed.

## 2021-12-16 ENCOUNTER — Other Ambulatory Visit: Payer: Self-pay | Admitting: Urology

## 2021-12-16 ENCOUNTER — Other Ambulatory Visit (HOSPITAL_COMMUNITY): Payer: Self-pay | Admitting: Urology

## 2021-12-16 DIAGNOSIS — C61 Malignant neoplasm of prostate: Secondary | ICD-10-CM

## 2022-01-01 ENCOUNTER — Encounter (HOSPITAL_COMMUNITY): Payer: Self-pay

## 2022-01-01 ENCOUNTER — Ambulatory Visit (HOSPITAL_COMMUNITY): Admission: RE | Admit: 2022-01-01 | Source: Ambulatory Visit

## 2022-01-29 ENCOUNTER — Ambulatory Visit (HOSPITAL_COMMUNITY)
